# Patient Record
Sex: Male | Born: 1995 | Hispanic: Yes | Marital: Married
Health system: Southern US, Community
[De-identification: ages and names within clinical notes are randomized; demographics above are authoritative.]

---
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
## 2016-10-12 ENCOUNTER — Other Ambulatory Visit: Payer: Self-pay | Admitting: Internal Medicine

## 2016-10-12 DIAGNOSIS — R945 Abnormal results of liver function studies: Secondary | ICD-10-CM

## 2016-10-12 DIAGNOSIS — R7989 Other specified abnormal findings of blood chemistry: Secondary | ICD-10-CM

## 2016-10-18 ENCOUNTER — Ambulatory Visit
Admission: RE | Admit: 2016-10-18 | Discharge: 2016-10-18 | Disposition: A | Discharge status: Home / Self Care | Payer: 59 | Source: Ambulatory Visit | Attending: Internal Medicine | Admitting: Internal Medicine

## 2016-10-18 DIAGNOSIS — K824 Cholesterolosis of gallbladder: Secondary | ICD-10-CM | POA: Diagnosis not present

## 2016-10-18 DIAGNOSIS — R935 Abnormal findings on diagnostic imaging of other abdominal regions, including retroperitoneum: Secondary | ICD-10-CM | POA: Diagnosis not present

## 2016-10-18 DIAGNOSIS — K769 Liver disease, unspecified: Secondary | ICD-10-CM | POA: Insufficient documentation

## 2016-10-18 DIAGNOSIS — R945 Abnormal results of liver function studies: Secondary | ICD-10-CM

## 2016-10-18 DIAGNOSIS — R7989 Other specified abnormal findings of blood chemistry: Secondary | ICD-10-CM

## 2018-08-14 IMAGING — US US ABDOMEN LIMITED
1 series · 13 of 25 positions shown · non-contrast
Comparison: None.

CLINICAL DATA: Abnormal liver enzymes

EXAM:
ULTRASOUND ABDOMEN LIMITED RIGHT UPPER QUADRANT

[Series 1: us abdomen limited · 0.23mm/px · 13 of 77 slices shown]
[im 1/77]
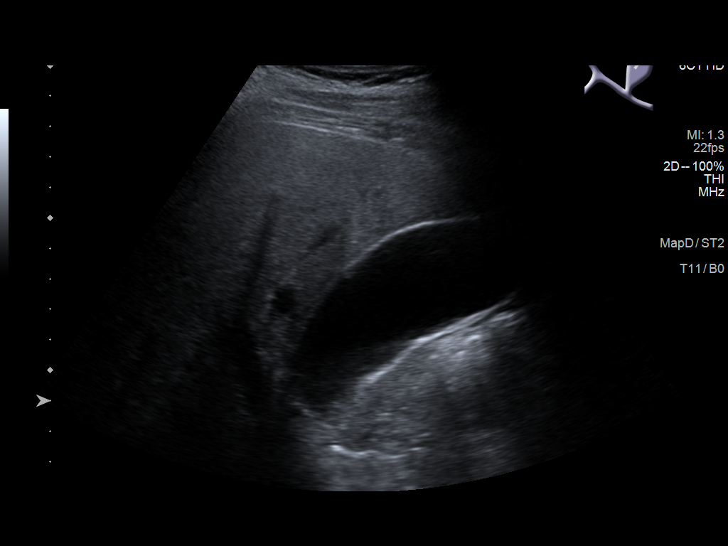
[im 7/77]
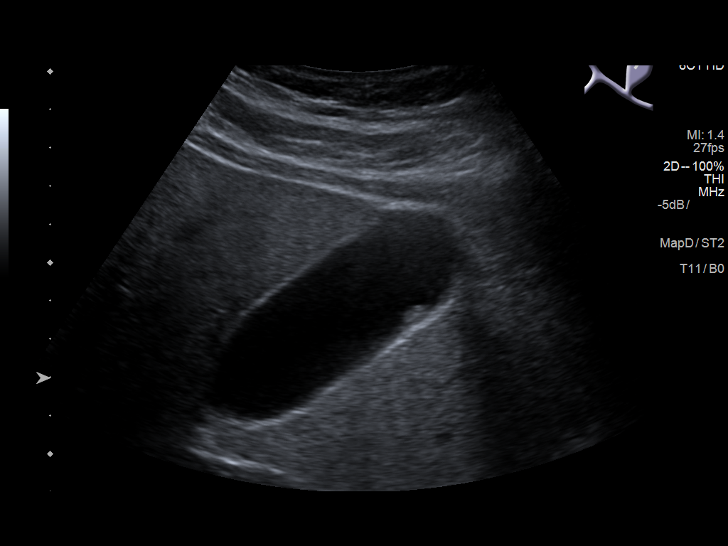
[im 13/77]
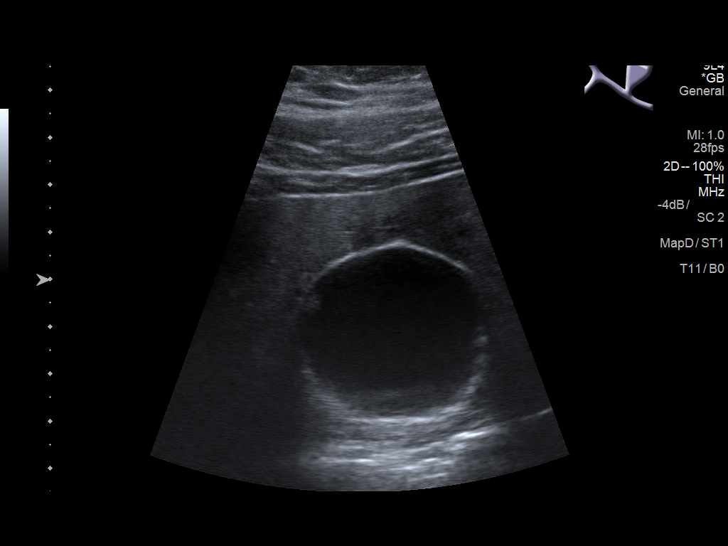
[im 20/77]
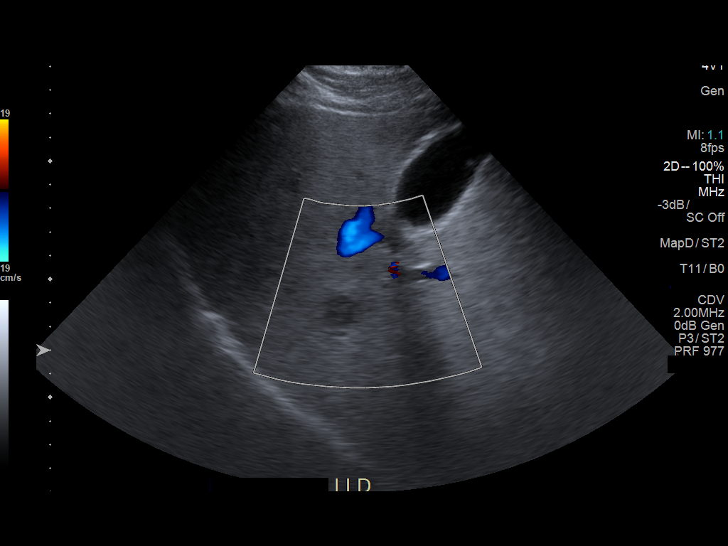
[im 26/77]
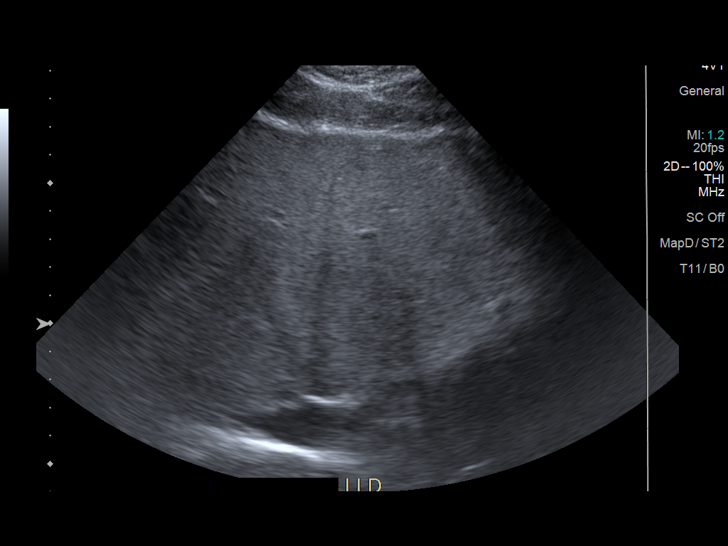
[im 32/77]
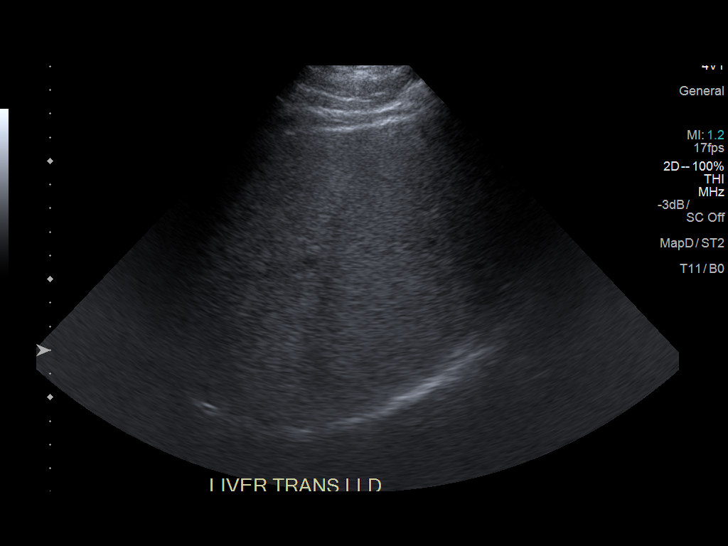
[im 39/77]
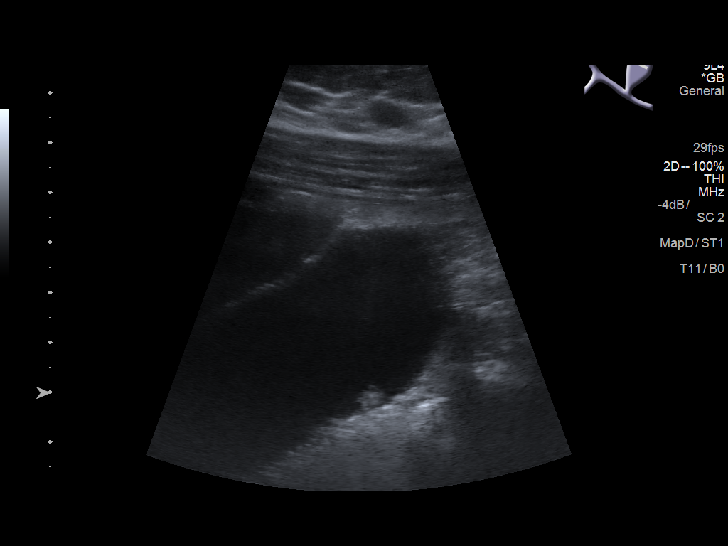
[im 45/77]
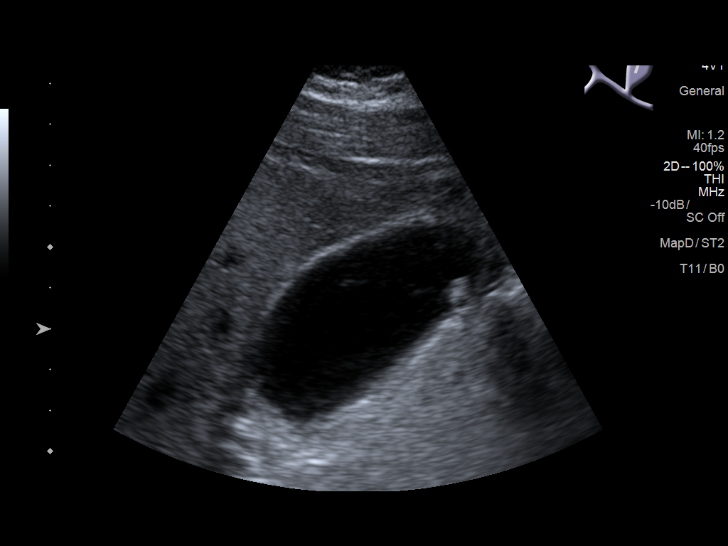
[im 51/77]
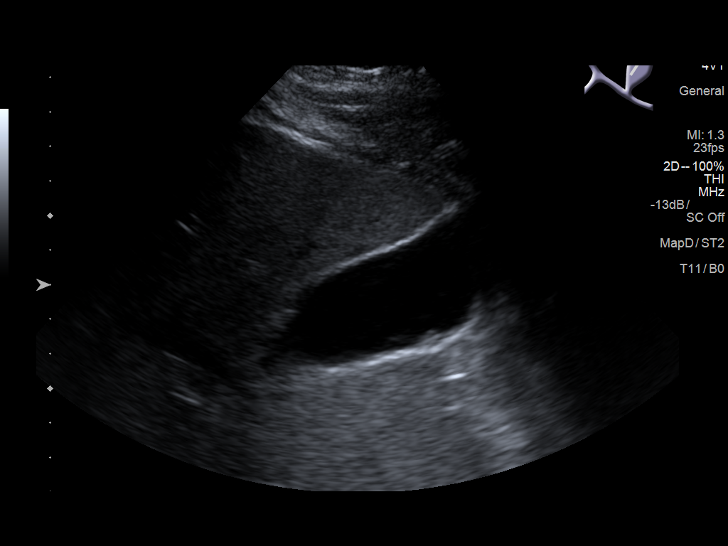
[im 58/77]
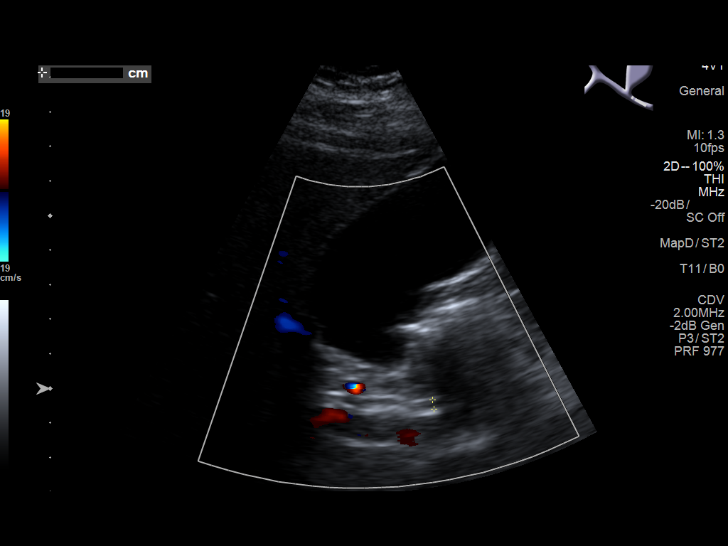
[im 64/77]
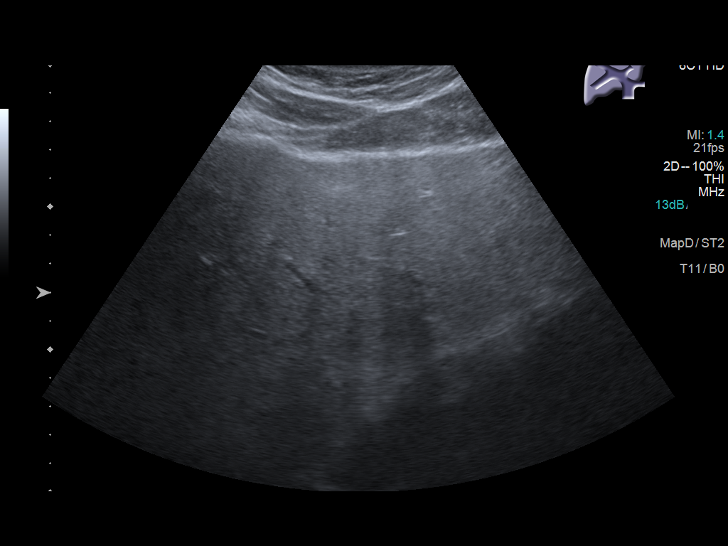
[im 70/77]
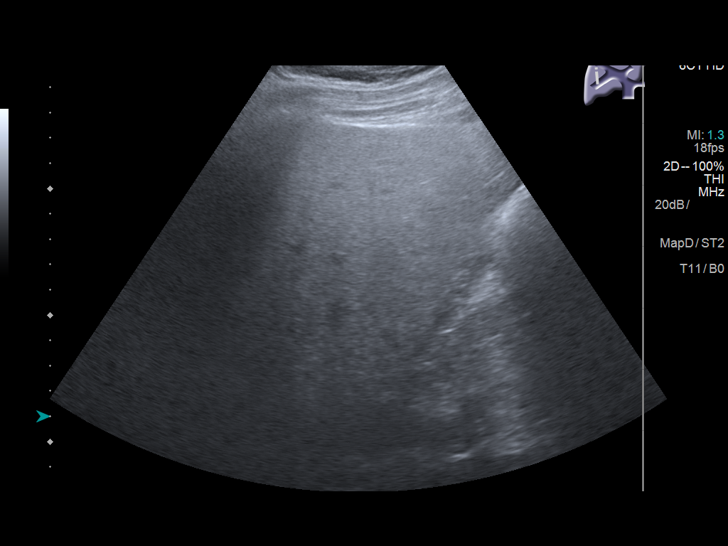
[im 77/77]
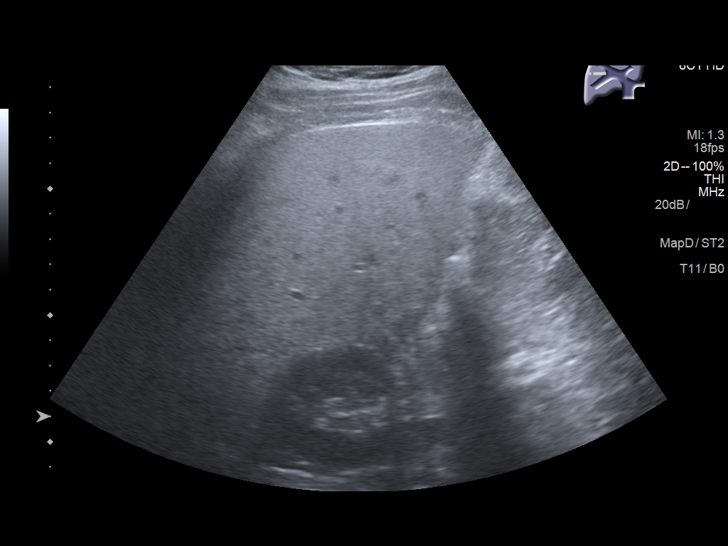

[13 of 25 positions shown; findings below may reference images not displayed]

FINDINGS: Gallbladder:

Within the gallbladder, there is an echogenic focus which neither
moves nor shadows measuring 7 mm in length. A similar appearing 6 mm
focus is noted in the gallbladder which neither moves nor shadows.
These areas are felt to represent polyps. There is an echogenic area
with comet tail artifact arising from the gallbladder wall, a
finding indicative of inflammatory focus such as cholesterolosis or
adenomyomatosis. There are no echogenic foci in the gallbladder
which move and shadow as is expected with gallstones. No gallbladder
wall thickening or pericholecystic fluid. No sonographic Murphy sign
noted by sonographer.

Common bile duct:

Diameter: 3 mm. No intrahepatic or extrahepatic biliary duct
dilatation.

Liver:

Liver echogenicity overall is increased. There is a hypoechoic focus
in the posterior segment of the right lobe of the liver measuring
1.7 x 1.5 x 2.2 cm. No other focal lesion evident. Portal vein is
patent on color Doppler imaging with normal direction of blood flow
towards the liver.
IMPRESSION: 1. There is an echogenic focus with comet tail type artifact in the
gallbladder indicative of focal inflammation such as adenomyomatosis
or cholesterolosis.

2. There are 2 apparent polyps, largest measuring 7 mm. A 7 mm polyp
warrants a follow-up ultrasound in 1 year to assess for stability.
No echogenic foci are seen which move and shadow as is expected
gallstones. No gallbladder wall thickening or pericholecystic fluid.

3. Hypoechoic focus in the right lobe of the liver. This area
potentially may represent fatty infiltration. Particular attention
to this area on 1 year follow-up ultrasound advised. If more
aggressive surveillance is felt to be warranted based on clinical
and laboratory findings, pre and serial post-contrast MR or CT could
be helpful to further evaluate. Note that there is increased
echogenicity in the liver, likely due to underlying hepatic
steatosis. This circumstance does limit sensitivity of ultrasound
for detection of focal liver lesions.

## 2019-01-23 ENCOUNTER — Other Ambulatory Visit: Payer: Self-pay

## 2019-01-23 DIAGNOSIS — Z20822 Contact with and (suspected) exposure to covid-19: Secondary | ICD-10-CM

## 2019-01-25 LAB — NOVEL CORONAVIRUS, NAA: SARS-CoV-2, NAA: NOT DETECTED

## 2019-01-30 ENCOUNTER — Ambulatory Visit: Payer: Medicaid Other | Attending: Internal Medicine

## 2019-01-30 DIAGNOSIS — Z20822 Contact with and (suspected) exposure to covid-19: Secondary | ICD-10-CM

## 2019-01-31 LAB — NOVEL CORONAVIRUS, NAA: SARS-CoV-2, NAA: NOT DETECTED

## 2021-11-17 ENCOUNTER — Ambulatory Visit
Admission: RE | Admit: 2021-11-17 | Discharge: 2021-11-17 | Disposition: A | Discharge status: Home / Self Care | Payer: Managed Care, Other (non HMO) | Attending: Family Medicine | Admitting: Family Medicine

## 2021-11-17 ENCOUNTER — Ambulatory Visit
Admission: RE | Admit: 2021-11-17 | Discharge: 2021-11-17 | Disposition: A | Discharge status: Home / Self Care | Payer: Managed Care, Other (non HMO) | Source: Ambulatory Visit | Attending: Physician Assistant | Admitting: Physician Assistant

## 2021-11-17 ENCOUNTER — Other Ambulatory Visit: Payer: Self-pay | Admitting: Physician Assistant

## 2021-11-17 DIAGNOSIS — R0781 Pleurodynia: Secondary | ICD-10-CM

## 2022-03-01 ENCOUNTER — Other Ambulatory Visit: Payer: Self-pay

## 2022-03-01 ENCOUNTER — Emergency Department
Admission: EM | Admit: 2022-03-01 | Discharge: 2022-03-01 | Disposition: A | Discharge status: Home / Self Care | Payer: Managed Care, Other (non HMO) | Attending: Emergency Medicine | Admitting: Emergency Medicine

## 2022-03-01 DIAGNOSIS — R1013 Epigastric pain: Secondary | ICD-10-CM | POA: Diagnosis not present

## 2022-03-01 DIAGNOSIS — R112 Nausea with vomiting, unspecified: Secondary | ICD-10-CM | POA: Diagnosis not present

## 2022-03-01 DIAGNOSIS — K8020 Calculus of gallbladder without cholecystitis without obstruction: Secondary | ICD-10-CM

## 2022-03-01 DIAGNOSIS — R109 Unspecified abdominal pain: Secondary | ICD-10-CM | POA: Diagnosis present

## 2022-03-01 LAB — CBC WITH DIFFERENTIAL/PLATELET
Abs Immature Granulocytes: 0.06 10*3/uL (ref 0.00–0.07)
Basophils Absolute: 0 10*3/uL (ref 0.0–0.1)
Basophils Relative: 0 %
Eosinophils Absolute: 0.1 10*3/uL (ref 0.0–0.5)
Eosinophils Relative: 1 %
HCT: 43.1 % (ref 39.0–52.0)
Hemoglobin: 14.2 g/dL (ref 13.0–17.0)
Immature Granulocytes: 1 %
Lymphocytes Relative: 11 %
Lymphs Abs: 1.3 10*3/uL (ref 0.7–4.0)
MCH: 30.3 pg (ref 26.0–34.0)
MCHC: 32.9 g/dL (ref 30.0–36.0)
MCV: 92.1 fL (ref 80.0–100.0)
Monocytes Absolute: 0.6 10*3/uL (ref 0.1–1.0)
Monocytes Relative: 5 %
Neutro Abs: 10.2 10*3/uL — ABNORMAL HIGH (ref 1.7–7.7)
Neutrophils Relative %: 82 %
Platelets: 337 10*3/uL (ref 150–400)
RBC: 4.68 MIL/uL (ref 4.22–5.81)
RDW: 13.1 % (ref 11.5–15.5)
WBC: 12.2 10*3/uL — ABNORMAL HIGH (ref 4.0–10.5)
nRBC: 0 % (ref 0.0–0.2)

## 2022-03-01 LAB — COMPREHENSIVE METABOLIC PANEL
ALT: 19 U/L (ref 0–44)
AST: 17 U/L (ref 15–41)
Albumin: 4.1 g/dL (ref 3.5–5.0)
Alkaline Phosphatase: 78 U/L (ref 38–126)
Anion gap: 8 (ref 5–15)
BUN: 16 mg/dL (ref 6–20)
CO2: 25 mmol/L (ref 22–32)
Calcium: 8.6 mg/dL — ABNORMAL LOW (ref 8.9–10.3)
Chloride: 103 mmol/L (ref 98–111)
Creatinine, Ser: 0.88 mg/dL (ref 0.61–1.24)
GFR, Estimated: >60 mL/min (ref >60)
Glucose, Bld: 106 mg/dL — ABNORMAL HIGH (ref 70–99)
Potassium: 3.6 mmol/L (ref 3.5–5.1)
Sodium: 136 mmol/L (ref 135–145)
Total Bilirubin: 0.7 mg/dL (ref 0.3–1.2)
Total Protein: 6.7 g/dL (ref 6.5–8.1)

## 2022-03-01 LAB — LIPASE, BLOOD: Lipase: 29 U/L (ref 11–51)

## 2022-03-01 MED ORDER — LACTATED RINGERS IV BOLUS
1000.0000 mL | Freq: Once | INTRAVENOUS | Status: AC
  Administered 2022-03-01: 1000 mL via INTRAVENOUS

## 2022-03-01 MED ORDER — ONDANSETRON 4 MG PO TBDP: 4.0000 mg | ORAL_TABLET | Freq: Three times a day (TID) | ORAL | 0 refills | Status: AC | PRN

## 2022-03-01 MED ORDER — ONDANSETRON HCL 4 MG/2ML IJ SOLN
4.0000 mg | Freq: Once | INTRAMUSCULAR | Status: AC
  Administered 2022-03-01: 4 mg via INTRAVENOUS
  Filled 2022-03-01: qty 2

## 2022-03-01 NOTE — ED Provider Notes (Signed)
[facility] Provider Note    Event Date/Time   First MD Initiated Contact with Patient 03/01/22 [phone]     (approximate)   History   Abdominal Pain   HPI  Dale Arroyo is a 27 y.o. male with no significant past medical history who presents with abdominal pain.  Symptoms started about 2 days ago.  He has had diarrhea and vomiting started last night as well.  Pain is located in the upper abdomen has been coming and going-year-old clear exacerbating or relieving factors.  Patient has been able to eat and drink some pain not worse after eating.  Denies history of prior abdominal surgeries.  No history of similar.  Does not drink alcohol.  Has had some chills but no fevers no urinary symptoms.    No past medical history on file.  There are no problems to display for this patient.    Physical Exam  Triage Vital Signs: ED Triage Vitals  Enc Vitals Group     BP 03/01/22 0916 131/77     Pulse Rate 03/01/22 0916 60     Resp 03/01/22 0916 18     Temp 03/01/22 0916 98 F (36.7 C)     Temp src --      SpO2 03/01/22 0916 98 %     Weight 03/01/22 0912 229 lb (103.9 kg)     Height 03/01/22 0912 5\' 7"  (1.702 m)     Head Circumference --      Peak Flow --      Pain Score 03/01/22 0912 0     Pain Loc --      Pain Edu? --      Excl. in [place]? --     Most recent vital signs: Vitals:   03/01/22 0916  BP: 131/77  Pulse: 60  Resp: 18  Temp: 98 F (36.7 C)  SpO2: 98%     General: Awake, no distress.  CV:  Good peripheral perfusion.  Resp:  Normal effort.  Abd:  No distention.  Mild tenderness palpation throughout the upper abdomen more in the epigastric region and left upper quadrant and less in the right upper quadrant, no right lower quadrant tenderness Neuro:             Awake, Alert, Oriented x 3  Other:     ED Results / Procedures / Treatments  Labs (all labs ordered are listed, but only abnormal results are displayed) Labs Reviewed   COMPREHENSIVE METABOLIC PANEL - Abnormal; Notable for the following components:      Result Value   Glucose, Bld 106 (*)    Calcium 8.6 (*)    All other components within normal limits  CBC WITH DIFFERENTIAL/PLATELET - Abnormal; Notable for the following components:   WBC 12.2 (*)    Neutro Abs 10.2 (*)    All other components within normal limits  LIPASE, BLOOD     EKG     RADIOLOGY I performed a bedside ultrasound of the gallbladder which shows multiple gallstones but no gallbladder wall thickening, gallbladder wall 1.5 mm, no pericholecystic fluid negative Murphy sign unable to visualize CBD   PROCEDURES:  Critical Care performed: No  Procedures    MEDICATIONS ORDERED IN ED: Medications  lactated ringers bolus 1,000 mL (1,000 mLs Intravenous Bolus from Bag 03/01/22 1022)  ondansetron (ZOFRAN) injection 4 mg (4 mg Intravenous Given 03/01/22 1014)     IMPRESSION / MDM / ASSESSMENT AND PLAN / ED COURSE  I reviewed the triage vital signs and the nursing notes.                              Patient's presentation is most consistent with acute complicated illness / injury requiring diagnostic workup.  Differential diagnosis includes, but is not limited to, gastroenteritis, gastritis, pancreatitis, biliary colic, less likely cholecystitis, appendicitis  Patient is a 27 year old male presenting with 2 days of upper abdominal pain as well as vomiting and diarrhea.  Still able to tolerate p.o.  Pain comes and goes without clear exacerbating or alleviating factors.  Denies history of similar.  No heavy alcohol use.  Patient's vitals are reassuring.  Does have some mild tenderness throughout the upper abdomen but primarily in the epigastric region and left upper quadrant compared to right upper quadrant.  He has no right lower quadrant tenderness.  Abdominal exam overall is benign without peritoneal findings.  Performed a bedside ultrasound of the gallbladder given the upper  abdominal pain he does have multiple gallstones however there is no other findings to suggest cholecystitis including no gallbladder wall thickening no pericholecystic fluid and a negative Murphy sign.  Will check basic labs including LFTs and lipase give a bolus of fluid and Zofran.  Suspect that this is most likely to be gastroenteritis my suspicion for acute abdomen is low based on location and nature of his pain and exam.  Labs notable for mild leukocytosis to 12 LFTs and lipase are within normal limits.  Patient feeling improved after fluids and Zofran.  Tolerating p.o.  Discussed return to ED for worsening pain fever or inability to tolerate p.o.  Will prescribe ODT Zofran.     FINAL CLINICAL IMPRESSION(S) / ED DIAGNOSES   Final diagnoses:  Epigastric pain  Nausea and vomiting, unspecified vomiting type     Rx / DC Orders   ED Discharge Orders     None        Note:  This document was prepared using Dragon voice recognition software and may include unintentional dictation errors.   Rada Hay, MD 03/01/22 1102

## 2022-03-01 NOTE — Discharge Instructions (Addendum)
I suspect that you have a viral illness which is causing your vomiting and diarrhea.  You can take the Zofran as needed for nausea and vomiting.  Stick to clear liquids until you are feeling better and avoid fatty or greasy foods.  You do have gallstones but there is no evidence that your gallbladder is infected.  Please return to the emergency department if you develop worsening pain fever or are unable to keep liquids down.

## 2022-03-01 NOTE — ED Triage Notes (Signed)
C/O RUQ abdominal pain x 2 days.  Worse after eating greasy foods.  Sent from [facility] for evaluation.  Patient is AAOx3.  Skin warm and dry> NAD
